# Patient Record
Sex: Male | Born: 2000 | Race: White | Hispanic: No | Marital: Single | Smoking: Never smoker
Health system: Southern US, Community
[De-identification: ages and names within clinical notes are randomized; demographics above are authoritative.]

## PROBLEM LIST (undated history)

## (undated) DIAGNOSIS — J45909 Unspecified asthma, uncomplicated: Secondary | ICD-10-CM

---
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
## 2020-06-03 ENCOUNTER — Encounter: Payer: Self-pay | Admitting: Emergency Medicine

## 2020-06-03 ENCOUNTER — Emergency Department
Admission: EM | Admit: 2020-06-03 | Discharge: 2020-06-03 | Disposition: A | Discharge status: Home / Self Care | Payer: Managed Care, Other (non HMO) | Attending: Emergency Medicine | Admitting: Emergency Medicine

## 2020-06-03 ENCOUNTER — Other Ambulatory Visit: Payer: Self-pay

## 2020-06-03 ENCOUNTER — Emergency Department: Payer: Managed Care, Other (non HMO)

## 2020-06-03 DIAGNOSIS — J45909 Unspecified asthma, uncomplicated: Secondary | ICD-10-CM | POA: Insufficient documentation

## 2020-06-03 DIAGNOSIS — Z23 Encounter for immunization: Secondary | ICD-10-CM | POA: Insufficient documentation

## 2020-06-03 DIAGNOSIS — Y998 Other external cause status: Secondary | ICD-10-CM | POA: Insufficient documentation

## 2020-06-03 DIAGNOSIS — S50311A Abrasion of right elbow, initial encounter: Secondary | ICD-10-CM

## 2020-06-03 DIAGNOSIS — W01198A Fall on same level from slipping, tripping and stumbling with subsequent striking against other object, initial encounter: Secondary | ICD-10-CM | POA: Insufficient documentation

## 2020-06-03 DIAGNOSIS — Y92488 Other paved roadways as the place of occurrence of the external cause: Secondary | ICD-10-CM | POA: Insufficient documentation

## 2020-06-03 DIAGNOSIS — S0181XA Laceration without foreign body of other part of head, initial encounter: Secondary | ICD-10-CM | POA: Diagnosis present

## 2020-06-03 DIAGNOSIS — Y9302 Activity, running: Secondary | ICD-10-CM | POA: Diagnosis not present

## 2020-06-03 HISTORY — DX: Unspecified asthma, uncomplicated: J45.909

## 2020-06-03 MED ORDER — TETANUS-DIPHTH-ACELL PERTUSSIS 5-2.5-18.5 LF-MCG/0.5 IM SUSP
0.5000 mL | Freq: Once | INTRAMUSCULAR | Status: AC
  Administered 2020-06-03: 0.5 mL via INTRAMUSCULAR
  Filled 2020-06-03: qty 0.5

## 2020-06-03 NOTE — ED Provider Notes (Signed)
[facility] Emergency Department Provider Note  ____________________________________________   First MD Initiated Contact with Patient 06/03/20 [phone]     (approximate)  I have reviewed the triage vital signs and the nursing notes.   HISTORY  Chief Complaint Laceration   HPI Darius Newman is a 19 y.o. male with past medical history of asthma who presents for assessment of a laceration to the right of his forehead that he sustained yesterday evening.  Patient states he was intoxicated after drinking an unknown amount of alcohol and was running when he tripped on a concrete curb and struck his forehead.  He is not sure if he had LOC.  He states he has a mild headache but denies any neck pain, chest pain, back pain, dental pain, lower extremity pain, left extremity pain, or other acute symptoms.  He does note he has a small abrasion over his right elbow that is not causing significant pain.  He is not sure when his last tetanus is up-to-date.  He is not on any anticoagulation.  He denies any other injuries or recent sick symptoms including fevers, chills, cough, vomiting, diarrhea, dysuria, or other acute complaints.         Past Medical History:  Diagnosis Date  . Asthma     There are no problems to display for this patient.   History reviewed. No pertinent surgical history.  Prior to Admission medications   Not on File    Allergies Patient has no known allergies.  No family history on file.  Social History Social History   Tobacco Use  . Smoking status: Never Smoker  . Smokeless tobacco: Never Used  Vaping Use  . Vaping Use: Never used  Substance Use Topics  . Alcohol use: Yes  . Drug use: Not Currently    Review of Systems  Review of Systems  Constitutional: Negative for chills and fever.  HENT: Negative for sore throat.   Eyes: Negative for pain.  Respiratory: Negative for cough and stridor.   Cardiovascular: Negative for chest  pain.  Gastrointestinal: Negative for vomiting.  Genitourinary: Negative for dysuria.  Musculoskeletal:       Cut to face   Skin: Negative for rash.  Neurological: Negative for seizures, loss of consciousness and headaches.  Psychiatric/Behavioral: Negative for suicidal ideas.  All other systems reviewed and are negative.     ____________________________________________   PHYSICAL EXAM:  VITAL SIGNS: ED Triage Vitals  Enc Vitals Group     BP 06/03/20 0333 118/90     Pulse Rate 06/03/20 0333 (!) 110     Resp 06/03/20 0333 18     Temp 06/03/20 0333 98.3 F (36.8 C)     Temp Source 06/03/20 0333 Oral     SpO2 06/03/20 0333 96 %     Weight 06/03/20 0332 165 lb (74.8 kg)     Height 06/03/20 0332 5\' 6"  (1.676 m)     Head Circumference --      Peak Flow --      Pain Score 06/03/20 0333 0     Pain Loc --      Pain Edu? --      Excl. in GC? --    Vitals:   06/03/20 0430 06/03/20 0739  BP: 122/76 111/79  Pulse: 98 89  Resp: 18 14  Temp: 98.2 F (36.8 C) 97.8 F (36.6 C)  SpO2: 98% 97%   Physical Exam Vitals and nursing note reviewed.  Constitutional:  Appearance: He is well-developed.  HENT:     Head: Normocephalic and atraumatic.     Right Ear: External ear normal.     Left Ear: External ear normal.     Nose: Nose normal.     Mouth/Throat:     Mouth: Mucous membranes are moist.  Eyes:     Conjunctiva/sclera: Conjunctivae normal.  Cardiovascular:     Rate and Rhythm: Normal rate and regular rhythm.     Heart sounds: No murmur heard.   Pulmonary:     Effort: Pulmonary effort is normal. No respiratory distress.     Breath sounds: Normal breath sounds.  Abdominal:     Palpations: Abdomen is soft.     Tenderness: There is no abdominal tenderness.  Musculoskeletal:     Cervical back: Neck supple.  Skin:    General: Skin is warm and dry.     Capillary Refill: Capillary refill takes less than 2 seconds.  Neurological:     Mental Status: He is alert and  oriented to person, place, and time.  Psychiatric:        Mood and Affect: Mood normal.     Patient has a 1 cm laceration that is linear and superficial hemostatic over the right forehead.  Oropharynx and remainder of face and neck are otherwise unremarkable.  No tenderness over the C/T/L-spine.  Patient has a small abrasion over his right elbow but there is no effusion or limitation of range of motion.  Upper extremities otherwise unremarkable.  ____________________________________________    ____________________________________________  RADIOLOGY  ED MD interpretation: No fracture or acute intracranial bleeding or other evidence of visceral injury.  Official radiology report(s): CT Head Wo Contrast  Result Date: 06/03/2020 CLINICAL DATA:  Fall from standing with head laceration EXAM: CT HEAD WITHOUT CONTRAST CT CERVICAL SPINE WITHOUT CONTRAST TECHNIQUE: Multidetector CT imaging of the head and cervical spine was performed following the standard protocol without intravenous contrast. Multiplanar CT image reconstructions of the cervical spine were also generated. COMPARISON:  None. FINDINGS: CT HEAD FINDINGS Brain: No evidence of acute infarction, hemorrhage, hydrocephalus, extra-axial collection or mass lesion/mass effect. Vascular: No hyperdense vessel or unexpected calcification. Skull: Forehead laceration without calvarial fracture. Sinuses/Orbits: No evidence of injury CT CERVICAL SPINE FINDINGS Alignment: Normal. Skull base and vertebrae: No acute fracture. No primary bone lesion or focal pathologic process. Soft tissues and spinal canal: No prevertebral fluid or swelling. No visible canal hematoma. Disc levels:  No degenerative changes Upper chest: No evidence of injury IMPRESSION: 1. No evidence of intracranial or cervical spine injury. 2. Forehead laceration without calvarial fracture. Electronically Signed   By: Marnee Spring M.D.   On: 06/03/2020 05:38   CT Cervical Spine Wo  Contrast  Result Date: 06/03/2020 CLINICAL DATA:  Fall from standing with head laceration EXAM: CT HEAD WITHOUT CONTRAST CT CERVICAL SPINE WITHOUT CONTRAST TECHNIQUE: Multidetector CT imaging of the head and cervical spine was performed following the standard protocol without intravenous contrast. Multiplanar CT image reconstructions of the cervical spine were also generated. COMPARISON:  None. FINDINGS: CT HEAD FINDINGS Brain: No evidence of acute infarction, hemorrhage, hydrocephalus, extra-axial collection or mass lesion/mass effect. Vascular: No hyperdense vessel or unexpected calcification. Skull: Forehead laceration without calvarial fracture. Sinuses/Orbits: No evidence of injury CT CERVICAL SPINE FINDINGS Alignment: Normal. Skull base and vertebrae: No acute fracture. No primary bone lesion or focal pathologic process. Soft tissues and spinal canal: No prevertebral fluid or swelling. No visible canal hematoma. Disc levels:  No degenerative  changes Upper chest: No evidence of injury IMPRESSION: 1. No evidence of intracranial or cervical spine injury. 2. Forehead laceration without calvarial fracture. Electronically Signed   By: Marnee Spring M.D.   On: 06/03/2020 05:38    ____________________________________________   PROCEDURES  Procedure(s) performed (including Critical Care):  Marland KitchenMarland KitchenLaceration Repair  Date/Time: 06/03/2020 9:16 AM Performed by: Gilles Chiquito, MD Authorized by: Gilles Chiquito, MD   Consent:    Consent obtained:  Verbal   Consent given by:  Patient   Risks discussed:  Pain, retained foreign body, poor cosmetic result and poor wound healing   Alternatives discussed:  No treatment and observation Laceration details:    Location:  Scalp   Length (cm):  1 Repair type:    Repair type:  Simple Exploration:    Wound exploration: wound explored through full range of motion     Contaminated: no   Treatment:    Area cleansed with:  Saline   Amount of cleaning:   Standard   Irrigation solution:  Sterile saline   Irrigation method:  Syringe Skin repair:    Repair method:  Sutures   Suture size:  6-0   Suture material:  Prolene   Number of sutures:  3 Approximation:    Approximation:  Close Post-procedure details:    Patient tolerance of procedure:  Tolerated well, no immediate complications     ____________________________________________   INITIAL IMPRESSION / ASSESSMENT AND PLAN / ED COURSE        Patient presents with left to history exam for assessment of laceration to his forehead he sustained yesterday after mechanical ground-level fall described above.  Patient is afebrile hemodynamically stable arrival.  CT of his head and neck showed no evidence of fracture or intracranial bleeding.  Lack repaired per procedure note above.  Tetanus updated.  Low suspicion for other significant visceral or occult injury.  Patient denies any other sick symptoms.  Discharge stable condition.  Strict return precautions advised and discussed.  Instructed on wound check and suture removal in 7 to 10 days.          ____________________________________________   FINAL CLINICAL IMPRESSION(S) / ED DIAGNOSES  Final diagnoses:  Laceration of forehead, initial encounter  Abrasion of right elbow, initial encounter    Medications  Tdap (BOOSTRIX) injection 0.5 mL (has no administration in time range)     ED Discharge Orders    None       Note:  This document was prepared using Dragon voice recognition software and may include unintentional dictation errors.   Gilles Chiquito, MD 06/03/20 [phone]

## 2020-06-03 NOTE — ED Triage Notes (Addendum)
Pt presents to ED via EMS with c/o laceration to head. Unwitnessed fall from standing; pt hit his forehead on concrete sidewalk. Unknown loc. Pt talkative and answering all questions appropriately at this time.  Bleeding controlled. EMS BP 118/83 HR 119 O2 97% on RA. +ETOH

## 2020-06-03 NOTE — ED Notes (Signed)
Pt seen walking in lobby, NAD

## 2020-08-09 ENCOUNTER — Emergency Department
Admission: EM | Admit: 2020-08-09 | Discharge: 2020-08-09 | Disposition: A | Discharge status: Home / Self Care | Payer: Managed Care, Other (non HMO) | Attending: Emergency Medicine | Admitting: Emergency Medicine

## 2020-08-09 ENCOUNTER — Emergency Department: Payer: Managed Care, Other (non HMO)

## 2020-08-09 ENCOUNTER — Other Ambulatory Visit: Payer: Self-pay

## 2020-08-09 DIAGNOSIS — W1789XA Other fall from one level to another, initial encounter: Secondary | ICD-10-CM | POA: Insufficient documentation

## 2020-08-09 DIAGNOSIS — J45909 Unspecified asthma, uncomplicated: Secondary | ICD-10-CM | POA: Diagnosis not present

## 2020-08-09 DIAGNOSIS — S0990XA Unspecified injury of head, initial encounter: Secondary | ICD-10-CM | POA: Diagnosis present

## 2020-08-09 DIAGNOSIS — Y9339 Activity, other involving climbing, rappelling and jumping off: Secondary | ICD-10-CM | POA: Insufficient documentation

## 2020-08-09 DIAGNOSIS — S060X0A Concussion without loss of consciousness, initial encounter: Secondary | ICD-10-CM

## 2020-08-09 DIAGNOSIS — Z20822 Contact with and (suspected) exposure to covid-19: Secondary | ICD-10-CM | POA: Diagnosis not present

## 2020-08-09 DIAGNOSIS — J069 Acute upper respiratory infection, unspecified: Secondary | ICD-10-CM | POA: Insufficient documentation

## 2020-08-09 LAB — GROUP A STREP BY PCR: Group A Strep by PCR: NOT DETECTED

## 2020-08-09 LAB — RESPIRATORY PANEL BY RT PCR (FLU A&B, COVID)
Influenza A by PCR: NEGATIVE
Influenza B by PCR: NEGATIVE
SARS Coronavirus 2 by RT PCR: NEGATIVE

## 2020-08-09 MED ORDER — AZITHROMYCIN 250 MG PO TABS: ORAL_TABLET | ORAL | 0 refills | Status: DC

## 2020-08-09 MED ORDER — ACETAMINOPHEN 325 MG PO TABS
650.0000 mg | ORAL_TABLET | Freq: Once | ORAL | Status: AC
  Administered 2020-08-09: 650 mg via ORAL
  Filled 2020-08-09: qty 2

## 2020-08-09 NOTE — ED Notes (Signed)
Patient transported to CT 

## 2020-08-09 NOTE — ED Triage Notes (Signed)
Pt states his friend told him he climbed a fence and fell off hitting his head on Saturday after being intoxicated, states he has had a HA with chills since. And read that is a sign of a concussion. Pt is a/ox4 with a steady gait.

## 2020-08-09 NOTE — ED Provider Notes (Signed)
[facility] Emergency Department Provider Note  ____________________________________________  Time seen: Approximately 2:06 PM  I have reviewed the triage vital signs and the nursing notes.   HISTORY  Chief Complaint Head Injury    HPI Darius Newman is a 19 y.o. male that presents to the emergency department for evaluation of headache following a head injury 3 days ago and nonproductive cough for 1.5 weeks and nasal congestion and chills yesterday.  Patient states that he was drinking on Saturday night and supposedly climbed on top of the fence, lost his balance and fell off hitting his head on concrete.  Patient reports that he "was not drunk " just has a bad memory when he drinks.  Headache started on Sunday morning when he woke up. Headache is around his forehead. He has not taken anything for his headache. No dizziness, visual changes, nausea, vomiting.  Patient also reports that he saw primary care about 1 week ago after developing a cough 1.5 weeks ago.  Patient has continued to cough for the last week and a half.  Yesterday he developed nasal congestion and chills.  He has a history of sinusitis. No shortness of breath, chest pain, abdominal pain.  Past Medical History:  Diagnosis Date  . Asthma     There are no problems to display for this patient.   No past surgical history on file.  Prior to Admission medications   Medication Sig Start Date End Date Taking? Authorizing Provider  azithromycin (ZITHROMAX Z-PAK) 250 MG tablet Take 2 tablets (500 mg) on  Day 1,  followed by 1 tablet (250 mg) once daily on Days 2 through 5. 08/09/20   Enid Derry, PA-C    Allergies Patient has no known allergies.  No family history on file.  Social History Social History   Tobacco Use  . Smoking status: Never Smoker  . Smokeless tobacco: Never Used  Vaping Use  . Vaping Use: Never used  Substance Use Topics  . Alcohol use: Yes  . Drug use: Not Currently      Review of Systems  Constitutional: Positive for chills. Eyes: No visual changes. No discharge. ENT: Positive for congestion and rhinorrhea. Cardiovascular: No chest pain. Respiratory: Positive for cough. No SOB. Gastrointestinal: No abdominal pain.  No nausea, no vomiting.  No diarrhea.  No constipation. Musculoskeletal: Negative for musculoskeletal pain. Skin: Negative for rash, abrasions, lacerations, ecchymosis. Neurological: Positive for headaches.   ____________________________________________   PHYSICAL EXAM:  VITAL SIGNS: ED Triage Vitals  Enc Vitals Group     BP 08/09/20 1137 137/80     Pulse Rate 08/09/20 1137 (!) 116     Resp 08/09/20 1137 16     Temp 08/09/20 1137 99.6 F (37.6 C)     Temp Source 08/09/20 1137 Oral     SpO2 08/09/20 1137 99 %     Weight 08/09/20 1136 152 lb (68.9 kg)     Height 08/09/20 1136 5\' 7"  (1.702 m)     Head Circumference --      Peak Flow --      Pain Score 08/09/20 1137 5     Pain Loc --      Pain Edu? --      Excl. in GC? --      Constitutional: Alert and oriented. Well appearing and in no acute distress. Playing on phone. Eyes: Conjunctivae are normal. PERRL. EOMI. No discharge. Head: Atraumatic. ENT: No frontal and maxillary sinus tenderness.  Ears: Tympanic membranes pearly gray with good landmarks. No discharge.      Nose: Mild congestion/rhinnorhea.      Mouth/Throat: Mucous membranes are moist. Oropharynx non-erythematous. Tonsils not enlarged. No exudates. Uvula midline. Neck: No stridor.   Hematological/Lymphatic/Immunilogical: No cervical lymphadenopathy. Cardiovascular: Normal rate, regular rhythm.  Good peripheral circulation. Respiratory: Normal respiratory effort without tachypnea or retractions. Lungs CTAB. Good air entry to the bases with no decreased or absent breath sounds. Gastrointestinal: Bowel sounds 4 quadrants. Soft and nontender to palpation. No guarding or rigidity. No palpable masses. No  distention. Musculoskeletal: Full range of motion to all extremities. No gross deformities appreciated. Neurologic:  Normal speech and language. No gross focal neurologic deficits are appreciated.  Skin:  Skin is warm, dry and intact. No rash noted. Psychiatric: Mood and affect are normal. Speech and behavior are normal. Patient exhibits appropriate insight and judgement.   ____________________________________________   LABS (all labs ordered are listed, but only abnormal results are displayed)  Labs Reviewed  GROUP A STREP BY PCR  RESPIRATORY PANEL BY RT PCR (FLU A&B, COVID)   ____________________________________________  EKG   ____________________________________________  RADIOLOGY Lexine Baton, personally viewed and evaluated these images (plain radiographs) as part of my medical decision making, as well as reviewing the written report by the radiologist.  DG Chest 1 View  Result Date: 08/09/2020 CLINICAL DATA:  Fall EXAM: CHEST  1 VIEW COMPARISON:  None. FINDINGS: The heart size and mediastinal contours are within normal limits. Both lungs are clear. No pleural effusion. The visualized skeletal structures are unremarkable. IMPRESSION: No acute process in the chest. Electronically Signed   By: Guadlupe Spanish M.D.   On: 08/09/2020 14:00   CT Head Wo Contrast  Result Date: 08/09/2020 CLINICAL DATA:  Head trauma. EXAM: CT HEAD WITHOUT CONTRAST TECHNIQUE: Contiguous axial images were obtained from the base of the skull through the vertex without intravenous contrast. COMPARISON:  06/03/2020. FINDINGS: Brain: No evidence of acute infarction, hemorrhage, hydrocephalus, extra-axial collection or mass lesion/mass effect. Vascular: No hyperdense vessel or unexpected calcification. Skull: Normal. Negative for fracture or focal lesion. Sinuses/Orbits: Ethmoid air cell mucosal thickening. Unremarkable orbits. Other: No mastoid effusions. IMPRESSION: No evidence of acute intracranial  abnormality. Electronically Signed   By: Feliberto Harts MD   On: 08/09/2020 13:03    ____________________________________________    PROCEDURES  Procedure(s) performed:    Procedures    Medications  acetaminophen (TYLENOL) tablet 650 mg (650 mg Oral Given 08/09/20 1247)     ____________________________________________   INITIAL IMPRESSION / ASSESSMENT AND PLAN / ED COURSE  Pertinent labs & imaging results that were available during my care of the patient were reviewed by me and considered in my medical decision making (see chart for details).  Review of the [place] CSRS was performed in accordance of the [facility] prior to dispensing any controlled drugs.     Patient's diagnosis is consistent with concussion andURI. Vital signs and exam are reassuring.  Covid, influenza, strep are negative.  Chest x-ray negative for acute cardiopulmonary processes.  C head CT negative for acute intracranial abnormality.  Patient's symptoms following his fall on Saturday night are consistent with a concussion.  I do not think they're related to the chills and nasal congestion he developed yesterday.  Patient has had a cough for the last week and a half.  Given length of time for his symptoms, patient will be started on azithromycin to cover him for a bacterial cause of URI.  Patient  feels comfortable going home. Patient will be discharged home with prescriptions for azithromycin. Patient is to follow up with primary care as needed or otherwise directed. Patient is given ED precautions to return to the ED for any worsening or new symptoms.   Gian Ybarra was evaluated in Emergency Department on 08/09/2020 for the symptoms described in the history of present illness. He was evaluated in the context of the global COVID-19 pandemic, which necessitated consideration that the patient might be at risk for infection with the SARS-CoV-2 virus that causes COVID-19. Institutional protocols and algorithms that  pertain to the evaluation of patients at risk for COVID-19 are in a state of rapid change based on information released by regulatory bodies including the CDC and federal and state organizations. These policies and algorithms were followed during the patient's care in the ED.  ____________________________________________  FINAL CLINICAL IMPRESSION(S) / ED DIAGNOSES  Final diagnoses:  Concussion without loss of consciousness, initial encounter  Upper respiratory tract infection, unspecified type      NEW MEDICATIONS STARTED DURING THIS VISIT:  ED Discharge Orders         Ordered    azithromycin (ZITHROMAX Z-PAK) 250 MG tablet        08/09/20 1422              This chart was dictated using voice recognition software/Dragon. Despite best efforts to proofread, errors can occur which can change the meaning. Any change was purely unintentional.    Enid Derry, PA-C 08/09/20 1519    Chesley Noon, MD 08/10/20 [phone]

## 2022-04-05 IMAGING — DX DG CHEST 1V
1 series · 1 of 1 positions shown · non-contrast
Comparison: None.

CLINICAL DATA: Fall

EXAM:
CHEST  1 VIEW

[chest ap]
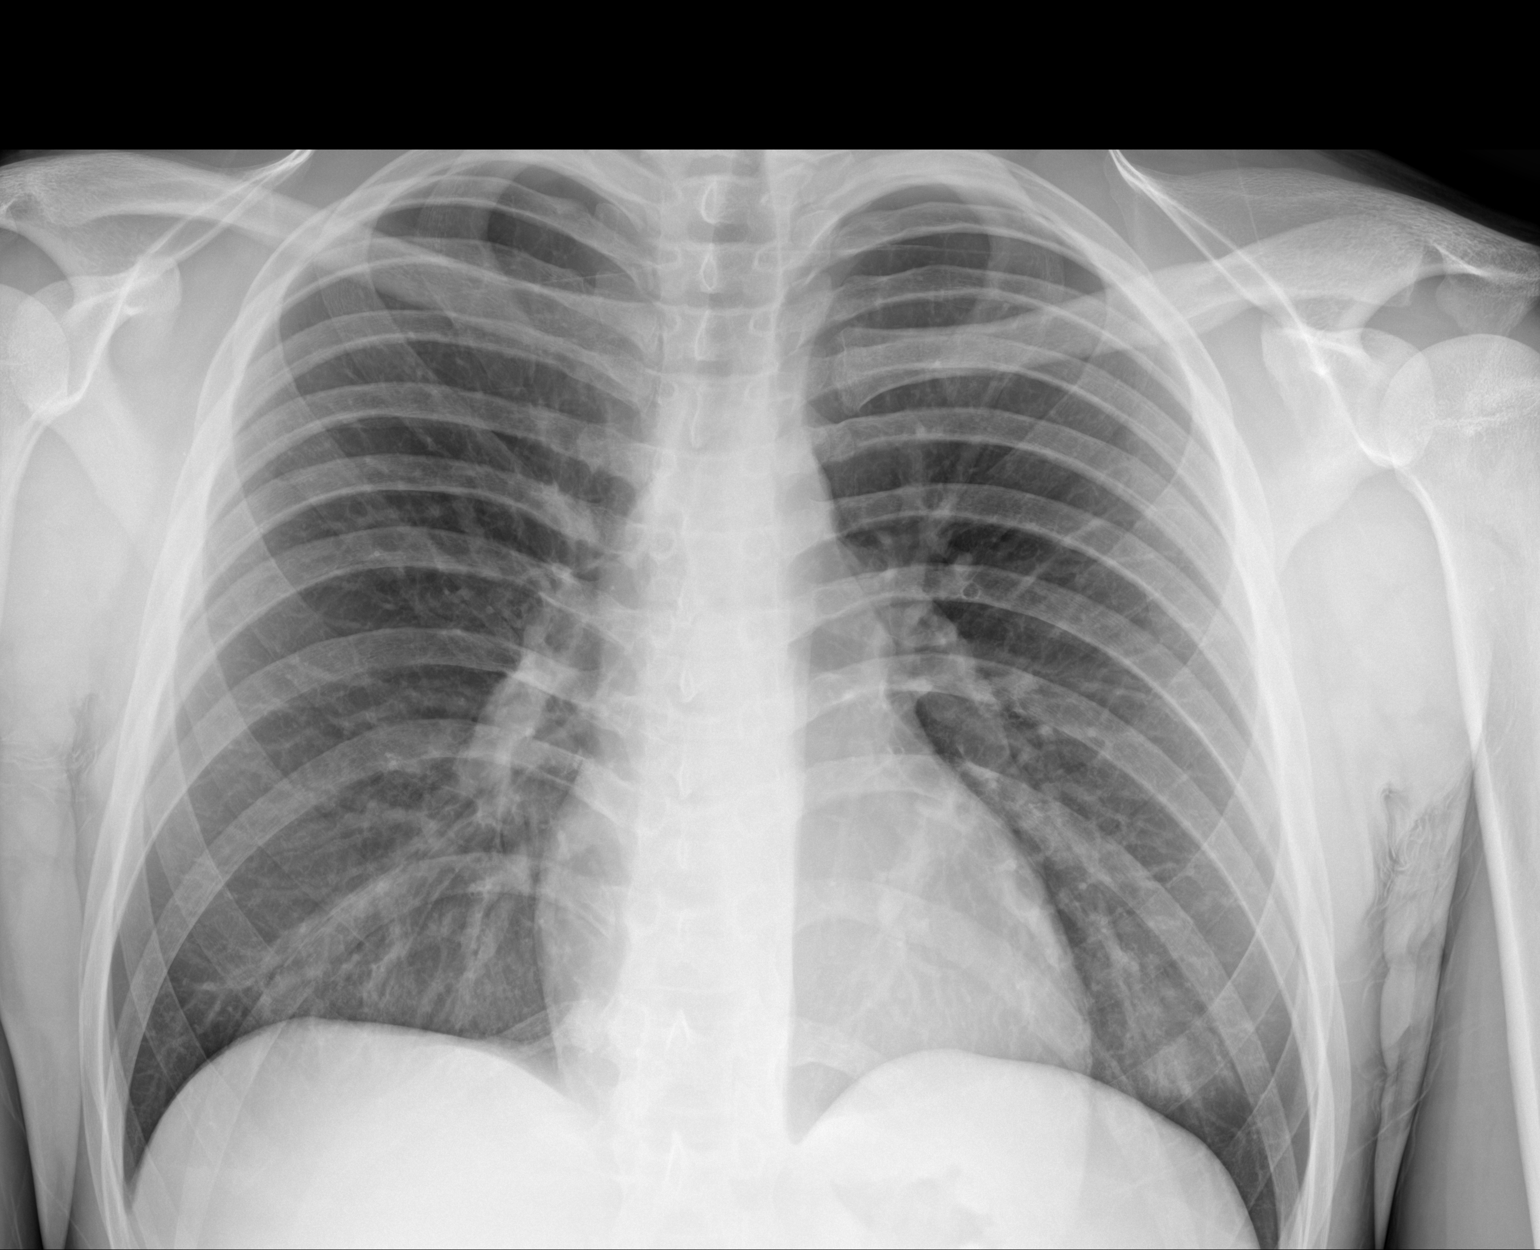

[1 of 1 positions shown; findings below may reference images not displayed]

FINDINGS: The heart size and mediastinal contours are within normal limits.
Both lungs are clear. No pleural effusion. The visualized skeletal
structures are unremarkable.
IMPRESSION: No acute process in the chest.

## 2022-04-05 IMAGING — CT CT HEAD W/O CM
3 series · 15 of 47 positions shown, 18 images · non-contrast
Comparison: 06/03/2020.

CLINICAL DATA: Head trauma.

EXAM:
CT HEAD WITHOUT CONTRAST
TECHNIQUE: Contiguous axial images were obtained from the base of the skull
through the vertex without intravenous contrast.

[Series 3: head wo · axial · 0.45mm/px · z∈[-142,-17]mm · 9 of 31 slices shown, 12 images]
[im 3/31  brain]
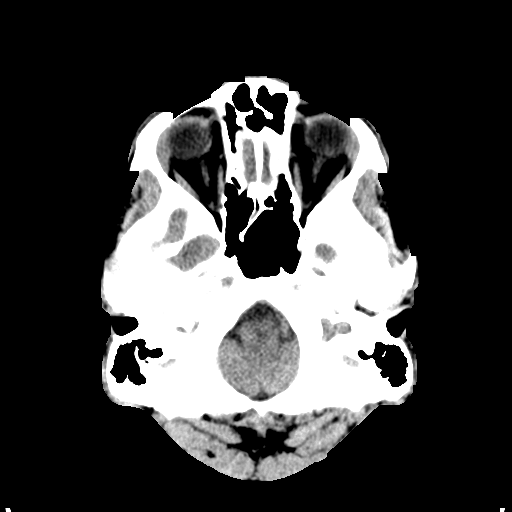
[im 3/31  bone]
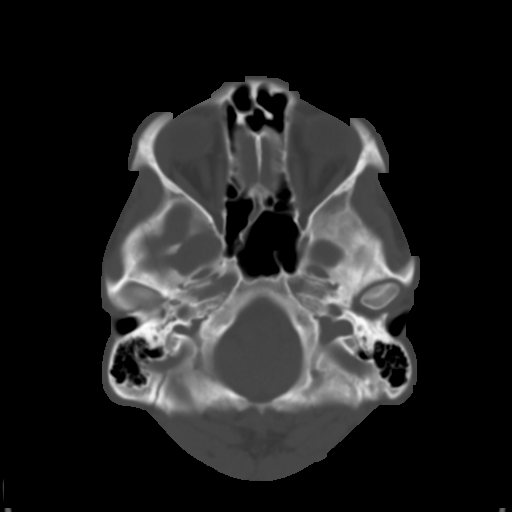
[im 6/31  brain]
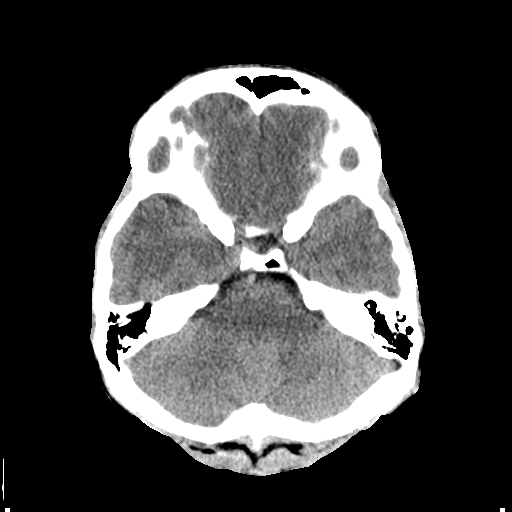
[im 9/31  brain]
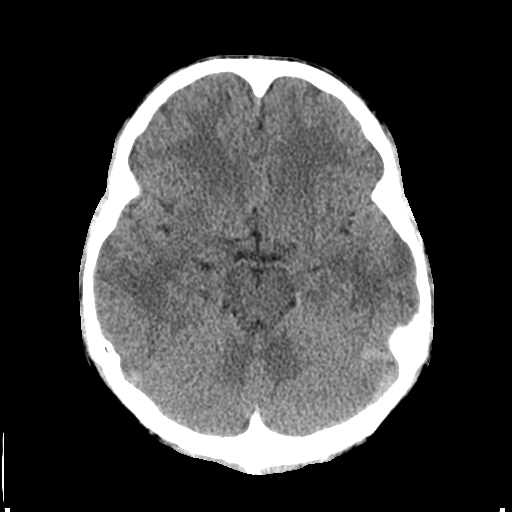
[im 12/31  brain]
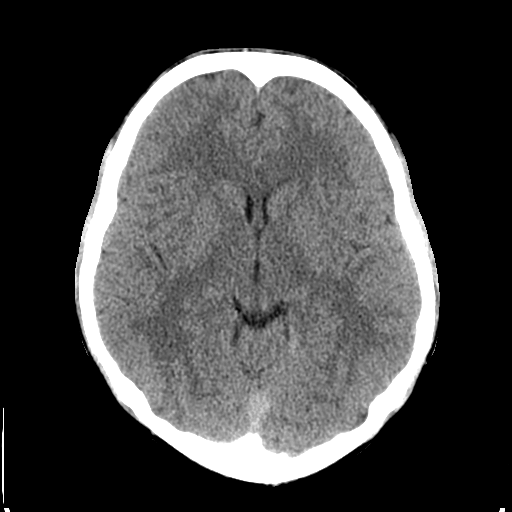
[im 16/31  brain]
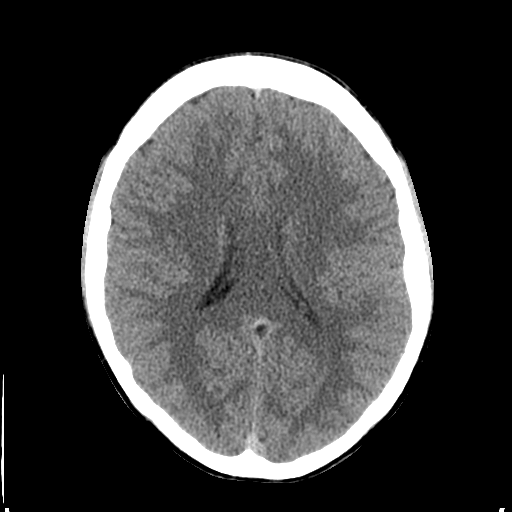
[im 16/31  bone]
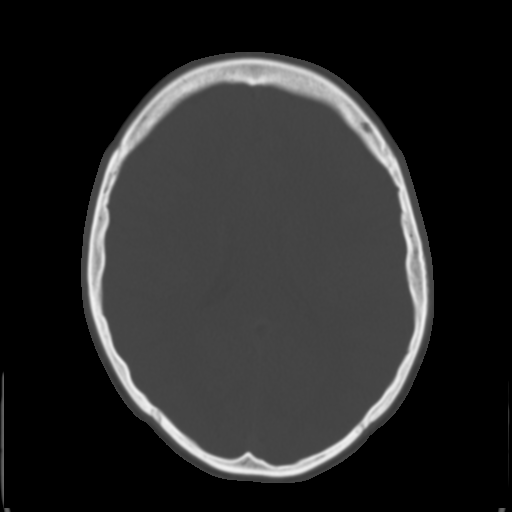
[im 19/31  brain]
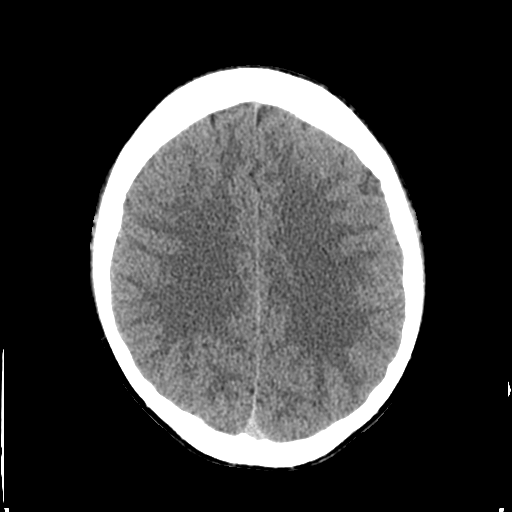
[im 22/31  brain]
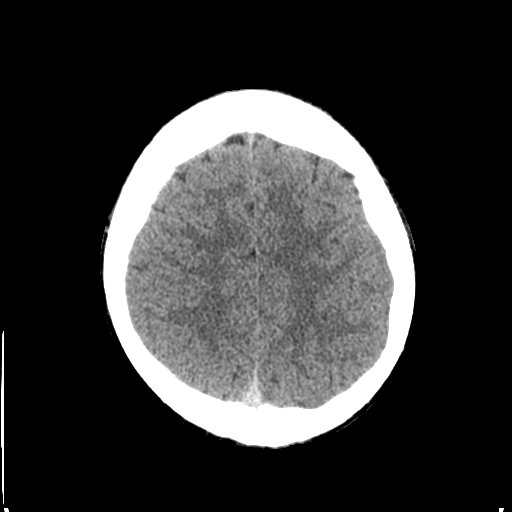
[im 25/31  brain]
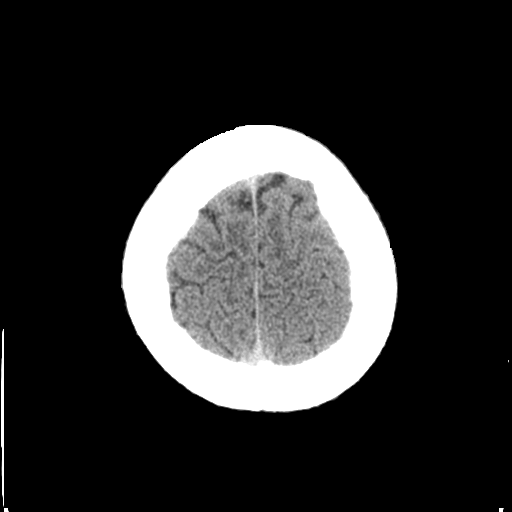
[im 28/31  brain]
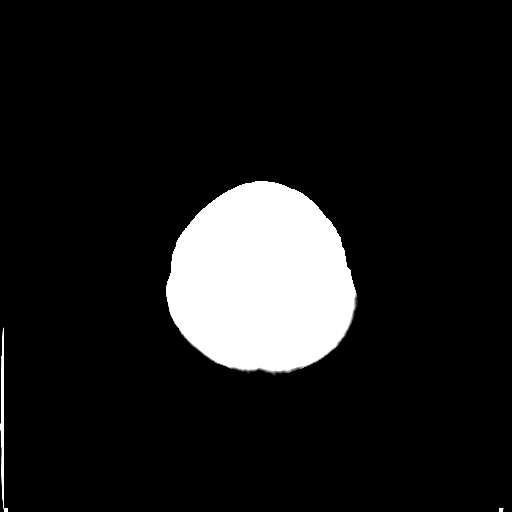
[im 28/31  bone]
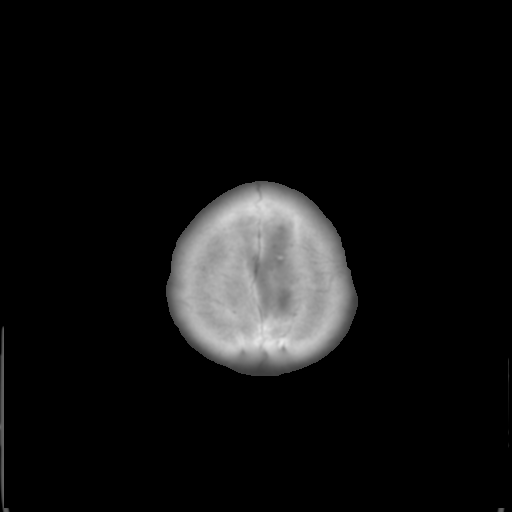

[Series 4: coronal soft tissue · coronal · 0.30mm/px · 3 of 68 slices shown]
[im 23/68  brain]
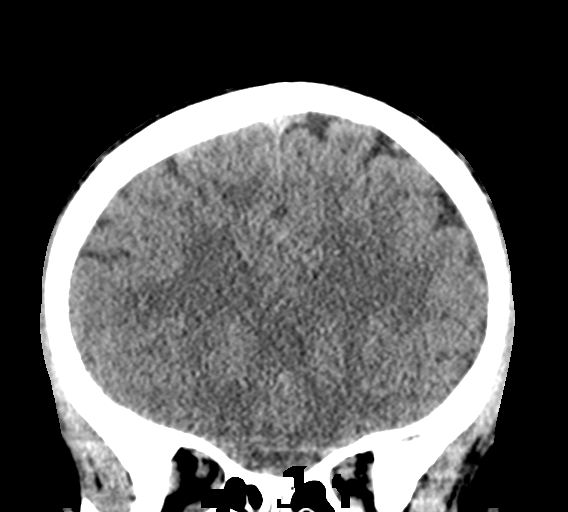
[im 30/68  brain]
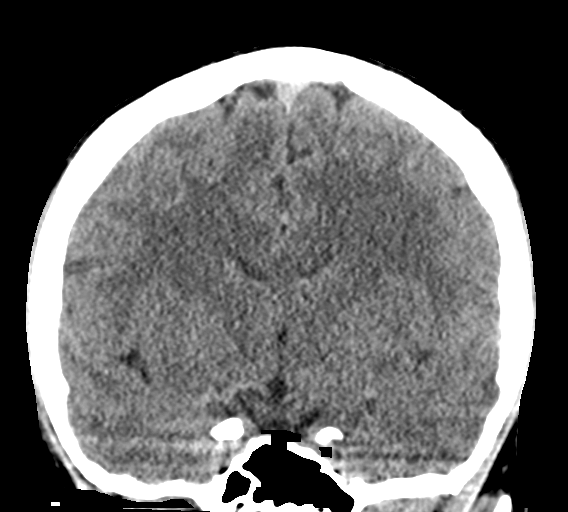
[im 38/68  brain]
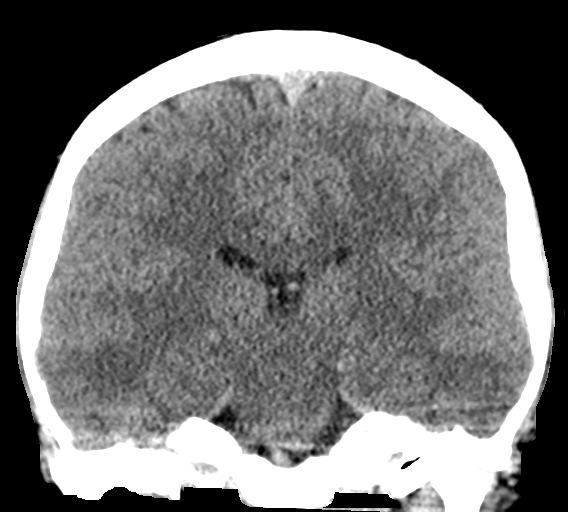

[Series 5: sagittal soft tissue · sagittal · 0.30mm/px · 3 of 58 slices shown]
[im 20/58  brain]
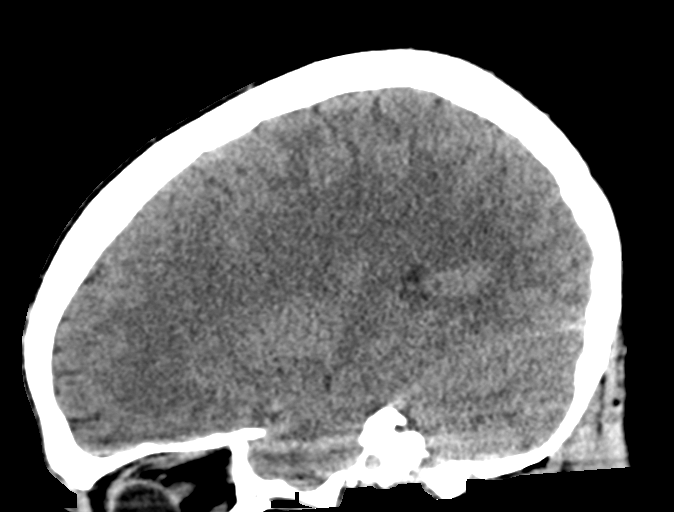
[im 29/58  brain]
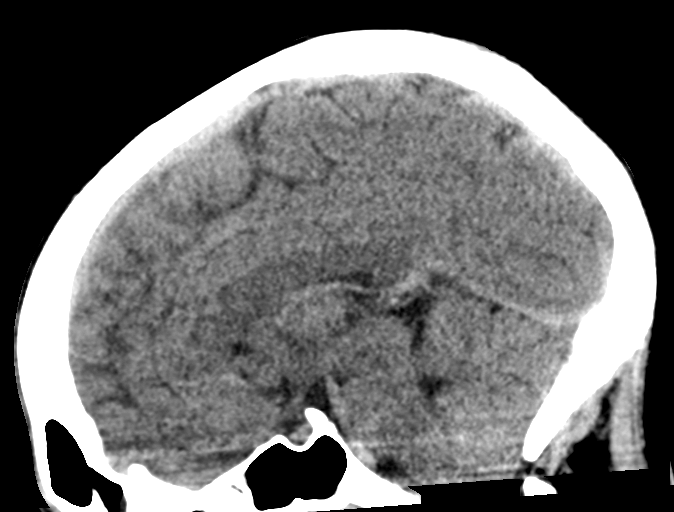
[im 39/58  brain]
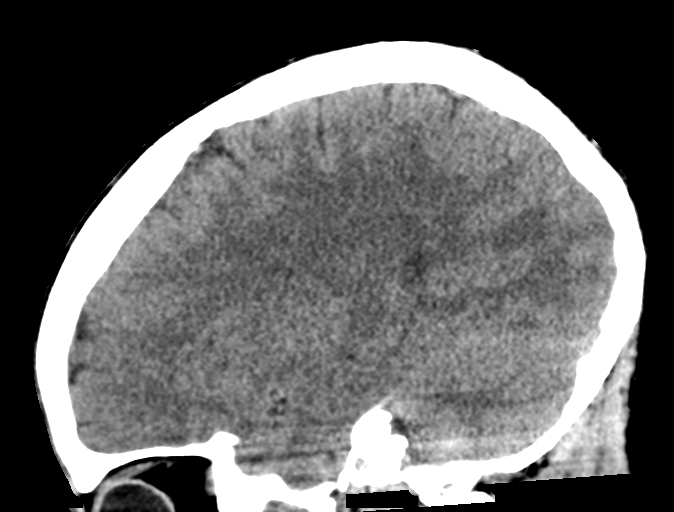

[15 of 47 positions shown; findings below may reference images not displayed]

FINDINGS: Brain: No evidence of acute infarction, hemorrhage, hydrocephalus,
extra-axial collection or mass lesion/mass effect.

Vascular: No hyperdense vessel or unexpected calcification.

Skull: Normal. Negative for fracture or focal lesion.

Sinuses/Orbits: Ethmoid air cell mucosal thickening. Unremarkable
orbits.

Other: No mastoid effusions.
IMPRESSION: No evidence of acute intracranial abnormality.

## 2023-05-21 ENCOUNTER — Other Ambulatory Visit: Payer: Self-pay

## 2023-05-21 ENCOUNTER — Encounter: Payer: Self-pay | Admitting: Family Medicine

## 2023-05-21 ENCOUNTER — Ambulatory Visit (INDEPENDENT_AMBULATORY_CARE_PROVIDER_SITE_OTHER): Payer: Managed Care, Other (non HMO) | Admitting: Family Medicine

## 2023-05-21 VITALS — BP 119/81 | HR 65 | Temp 97.8°F | Ht 66.93 in | Wt 176.0 lb

## 2023-05-21 DIAGNOSIS — R21 Rash and other nonspecific skin eruption: Secondary | ICD-10-CM

## 2023-05-21 MED ORDER — DOXYCYCLINE HYCLATE 100 MG PO TABS: 100.0000 mg | ORAL_TABLET | Freq: Two times a day (BID) | ORAL | 0 refills | Status: AC

## 2023-05-21 NOTE — Progress Notes (Signed)
[facility] [address] [place], [place] [address] Phone: [phone] Fax: [phone]   Office Visit Note  Patient Name: Darius Newman  Date of Birth:08-10-2001  Med Rec number [id number]  Date of Service: 05/21/2023  Dust mite extract  Chief Complaint  Patient presents with   Rash     From [place] and [place] [facility] yesterday  Was on an [place] for break and has had rash since Friday - very wooded, no hot hot tub use Itching, using benadryl topically  Had no tick bites that he knows of  Rash      Current Medication:  Outpatient Encounter Medications as of 05/21/2023  Medication Sig   albuterol (VENTOLIN HFA) 108 (90 Base) MCG/ACT inhaler Inhale into the lungs.   fluticasone (FLONASE) 50 MCG/ACT nasal spray Place into the nose.   fluticasone (FLOVENT HFA) 110 MCG/ACT inhaler INHALE 2 PUFFS TWICE A DAY RINSE MOUTH WITH WATER AFTER USE, DO NOT SWALLOW   montelukast (SINGULAIR) 10 MG tablet TAKE 1 TABLET BY MOUTH EVERY DAY AT NIGHT   [DISCONTINUED] azithromycin (ZITHROMAX Z-PAK) 250 MG tablet Take 2 tablets (500 mg) on  Day 1,  followed by 1 tablet (250 mg) once daily on Days 2 through 5. (Patient not taking: Reported on 05/21/2023)   No facility-administered encounter medications on file as of 05/21/2023.      Medical History: Past Medical History:  Diagnosis Date   Asthma      Vital Signs: BP 119/81   Pulse 65   Temp 97.8 F (36.6 C) (Tympanic)   Ht 5' 6.93" (1.7 m)   Wt 176 lb (79.8 kg)   SpO2 98%   BMI 27.62 kg/m    Review of Systems  Constitutional: Negative.   Skin:  Positive for rash.    Physical Exam Vitals reviewed.  Constitutional:      Appearance: Normal appearance.  Skin:    Findings: Rash present. Rash is papular and pustular.     Comments: Scattered lesions on legs and buttocks - many based around hair follicles, abraded, some papular, no unroofed pustules, no vesicles  Neurological:     Mental Status: He is alert.      Assessment/Plan:  1. Rash Suspect started as sandfly bites or similar but now some secondary infection leading to folliculitis. Will cover with antibiotic Also take loratadine (generic Claritin) two times daily - doxycycline (VIBRA-TABS) 100 MG tablet; Take 1 tablet (100 mg total) by mouth 2 (two) times daily.  Dispense: 20 tablet; Refill: 0    I will check in with Darius Newman on Thursday to assess progress  General Counseling: Darius Newman verbalizes understanding of the findings of todays visit and agrees with plan of treatment. I have discussed any further diagnostic evaluation that may be needed or ordered today. We also reviewed his medications today. he has been encouraged to call the office with any questions or concerns that should arise related to todays visit.   No orders of the defined types were placed in this encounter.   No orders of the defined types were placed in this encounter.      Dr Durwin Reges Saleh Ulbrich [facility] University Physician
